# Patient Record
Sex: Female | Born: 1960 | Hispanic: Yes | Marital: Single | Smoking: Never smoker
Health system: Southern US, Community
[De-identification: ages and names within clinical notes are randomized; demographics above are authoritative.]

---

## 2018-10-07 ENCOUNTER — Ambulatory Visit: Payer: Self-pay

## 2018-10-20 ENCOUNTER — Ambulatory Visit: Payer: Self-pay

## 2018-11-27 ENCOUNTER — Other Ambulatory Visit: Payer: Self-pay

## 2018-12-01 ENCOUNTER — Other Ambulatory Visit: Payer: Self-pay

## 2018-12-02 ENCOUNTER — Ambulatory Visit
Admission: RE | Admit: 2018-12-02 | Discharge: 2018-12-02 | Disposition: A | Discharge status: Home / Self Care | Payer: Self-pay | Source: Ambulatory Visit | Attending: Oncology | Admitting: Oncology

## 2018-12-02 ENCOUNTER — Other Ambulatory Visit: Payer: Self-pay

## 2018-12-02 ENCOUNTER — Ambulatory Visit: Payer: Self-pay | Attending: Oncology

## 2018-12-02 VITALS — BP 133/61 | HR 78 | Temp 99.5°F | Ht <= 58 in | Wt 126.0 lb

## 2018-12-02 DIAGNOSIS — Z0000 Encounter for general adult medical examination without abnormal findings: Secondary | ICD-10-CM | POA: Insufficient documentation

## 2018-12-02 NOTE — Progress Notes (Signed)
  Subjective:     Patient ID: Alexandria Ayala, female   DOB: 1960-09-30, 58 y.o.   MRN: [id number]  HPI   Review of Systems     Objective:   Physical Exam Chest:     Breasts:        Right: No swelling, bleeding, inverted nipple, mass, nipple discharge, skin change or tenderness.        Left: No swelling, bleeding, inverted nipple, mass, nipple discharge, skin change or tenderness.     Comments: Right axillary axillary fatty tissue Genitourinary:    Labia:        Right: No rash, tenderness, lesion or injury.        Left: No rash, tenderness, lesion or injury.      Vagina: No signs of injury and foreign body. No vaginal discharge, erythema, tenderness, bleeding, lesions or prolapsed vaginal walls.     Cervix: No cervical motion tenderness, discharge, friability, lesion, erythema, cervical bleeding or eversion.     Uterus: Not deviated, not enlarged, not fixed, not tender and no uterine prolapse.      Adnexa:        Right: No mass, tenderness or fullness.         Left: No mass, tenderness or fullness.          Assessment:     58 year old hispanic patient presents for BCCCP clinic visit.  Jaqui Laukaitis interpreted exam.  Patient screened, and meets BCCCP eligibility.  Patient does not have insurance, Medicare or Medicaid. Instructed patient on breast self awareness using teach back method.  Clinical breast exam unremarkable.  Patient had LEEP performed at [facility] in April 2020.  CIN3 pathology result.  Recommendation for 4 month follow -up pap.  Patient requests to have pap and follow-up transferred here.     Plan:  Sent for bilateral screening mammogram.  Specimen collected for pap.

## 2018-12-03 ENCOUNTER — Other Ambulatory Visit: Payer: Self-pay

## 2018-12-03 DIAGNOSIS — R920 Mammographic microcalcification found on diagnostic imaging of breast: Secondary | ICD-10-CM

## 2018-12-09 LAB — PAP LB AND HPV HIGH-RISK: HPV, high-risk: NEGATIVE

## 2018-12-14 ENCOUNTER — Ambulatory Visit
Admission: RE | Admit: 2018-12-14 | Discharge: 2018-12-14 | Disposition: A | Discharge status: Home / Self Care | Payer: Self-pay | Source: Ambulatory Visit | Attending: Oncology | Admitting: Oncology

## 2018-12-14 DIAGNOSIS — R920 Mammographic microcalcification found on diagnostic imaging of breast: Secondary | ICD-10-CM

## 2018-12-15 ENCOUNTER — Other Ambulatory Visit: Payer: Self-pay | Admitting: *Deleted

## 2018-12-15 DIAGNOSIS — R920 Mammographic microcalcification found on diagnostic imaging of breast: Secondary | ICD-10-CM

## 2018-12-22 ENCOUNTER — Ambulatory Visit
Admission: RE | Admit: 2018-12-22 | Discharge: 2018-12-22 | Disposition: A | Discharge status: Home / Self Care | Payer: Self-pay | Source: Ambulatory Visit | Attending: Oncology | Admitting: Oncology

## 2018-12-22 DIAGNOSIS — R920 Mammographic microcalcification found on diagnostic imaging of breast: Secondary | ICD-10-CM

## 2018-12-22 HISTORY — PX: BREAST BIOPSY: SHX20

## 2018-12-23 LAB — SURGICAL PATHOLOGY

## 2019-01-04 NOTE — Progress Notes (Signed)
Phoned patient with Erich Montane interpreter to discuss BCCCP reslts.  Pap result ASCUS/HPV Negative.  Will repeat pap in 3 Years.  Patient will be scheduled for 6 month follow-up mammogram for calcifications in lower left breast.

## 2019-02-11 ENCOUNTER — Other Ambulatory Visit: Payer: Self-pay

## 2019-02-11 DIAGNOSIS — R920 Mammographic microcalcification found on diagnostic imaging of breast: Secondary | ICD-10-CM

## 2019-02-11 NOTE — Progress Notes (Signed)
Patient scheduled to return 06/21/2019 for 6 month follow-up left breast mammogram/calcifications.  Mailed appointment information.  Copy to [facility].

## 2019-06-21 ENCOUNTER — Ambulatory Visit
Admission: RE | Admit: 2019-06-21 | Discharge: 2019-06-21 | Disposition: A | Discharge status: Home / Self Care | Payer: Self-pay | Source: Ambulatory Visit | Attending: Oncology | Admitting: Oncology

## 2019-06-21 DIAGNOSIS — R920 Mammographic microcalcification found on diagnostic imaging of breast: Secondary | ICD-10-CM | POA: Insufficient documentation

## 2019-06-22 NOTE — Progress Notes (Unsigned)
6 month follow up mammogram with Birads 3 results.  Will schedule annual and 6 month follow-up BCCCP visit and mail appointment reminder to patient.

## 2019-07-09 ENCOUNTER — Ambulatory Visit: Payer: Self-pay | Attending: Internal Medicine

## 2019-07-09 DIAGNOSIS — Z23 Encounter for immunization: Secondary | ICD-10-CM

## 2019-07-09 NOTE — Progress Notes (Signed)
   Covid-19 Vaccination Clinic  Name:  Takasha Vetere    MRN: [id number] DOB: 18-Jun-1960  07/09/2019  [name] Edward Jolly was observed post Covid-19 immunization for 15 minutes without incident. She was provided with Vaccine Information Sheet and instruction to access the V-Safe system.   Ms. Aislyn Hayse was instructed to call 911 with any severe reactions post vaccine: Marland Kitchen Difficulty breathing  . Swelling of face and throat  . A fast heartbeat  . A bad rash all over body  . Dizziness and weakness   Immunizations Administered    Name Date Dose VIS Date Route   Pfizer COVID-19 Vaccine 07/09/2019  4:27 PM 0.3 mL 03/26/2019 Intramuscular   Manufacturer: [employer], [employer]   Lot: [id number]   NDC: [address]-4

## 2019-07-30 ENCOUNTER — Ambulatory Visit: Payer: Self-pay | Attending: Internal Medicine

## 2019-07-30 DIAGNOSIS — Z23 Encounter for immunization: Secondary | ICD-10-CM

## 2019-07-30 NOTE — Progress Notes (Signed)
   Covid-19 Vaccination Clinic  Name:  Alexandria Ayala    MRN: [id number] DOB: Feb 06, 1961  07/30/2019  [name] Edward Jolly was observed post Covid-19 immunization for 15 minutes without incident. She was provided with Vaccine Information Sheet and instruction to access the V-Safe system.   [name] was instructed to call 911 with any severe reactions post vaccine: Marland Kitchen Difficulty breathing  . Swelling of face and throat  . A fast heartbeat  . A bad rash all over body  . Dizziness and weakness   Immunizations Administered    Name Date Dose VIS Date Route   Pfizer COVID-19 Vaccine 07/30/2019  3:48 PM 0.3 mL 03/26/2019 Intramuscular   Manufacturer: [employer], [employer]   Lot: [id number]   NDC: [address]-6

## 2019-12-22 ENCOUNTER — Ambulatory Visit: Payer: Self-pay | Attending: Oncology | Admitting: *Deleted

## 2019-12-22 ENCOUNTER — Ambulatory Visit
Admission: RE | Admit: 2019-12-22 | Discharge: 2019-12-22 | Disposition: A | Discharge status: Home / Self Care | Payer: Self-pay | Source: Ambulatory Visit | Attending: Oncology | Admitting: Oncology

## 2019-12-22 ENCOUNTER — Encounter: Payer: Self-pay | Admitting: *Deleted

## 2019-12-22 ENCOUNTER — Other Ambulatory Visit: Payer: Self-pay

## 2019-12-22 ENCOUNTER — Encounter (INDEPENDENT_AMBULATORY_CARE_PROVIDER_SITE_OTHER): Payer: Self-pay

## 2019-12-22 VITALS — BP 116/71 | HR 68 | Temp 98.2°F | Ht <= 58 in | Wt 125.0 lb

## 2019-12-22 DIAGNOSIS — R920 Mammographic microcalcification found on diagnostic imaging of breast: Secondary | ICD-10-CM

## 2019-12-22 NOTE — Progress Notes (Signed)
°  Subjective:     Patient ID: Alexandria Ayala, female   DOB: December 28, 1960, 59 y.o.   MRN: [id number]    Review of Systems     Objective:   Physical Exam Chest:     Breasts:        Right: No swelling, bleeding, inverted nipple, mass, nipple discharge, skin change or tenderness.        Left: No swelling, bleeding, inverted nipple, mass, nipple discharge, skin change or tenderness.  Lymphadenopathy:     Upper Body:     Right upper body: No supraclavicular or axillary adenopathy.     Left upper body: No supraclavicular or axillary adenopathy.        Assessment:     59 year old Hispanic female returns to [facility] for 6 month follow up and annual mammogram.  Last mammogram on 06/21/19 was a birads 3 with calcifications.  Last pap on 12/02/18 was ASCUS/HPV negative.  Next pap due in 2023.  Clinical breast exam without dominant mass, skin changes, nipple discharge, or lymphadenopathy.  Taught self breast awareness.  Patient has been screened for eligibility.  She does not have any insurance, Medicare or Medicaid.  She also meets financial eligibility.   Risk Assessment   No risk assessment data for the current encounter  Risk Scores      12/02/2018   Last edited by: Jim Like, RN   5-year risk: 1 %   Lifetime risk: 5.9 %            Risk Assessment    Risk Scores      12/22/2019 12/02/2018   Last edited by: Alta Corning, CMA Jim Like, RN   5-year risk: 1 % 1 %   Lifetime risk: 5.8 % 5.9 %         Plan:     Bilateral diagnositic mammogram and ultrasound ordered.  Will follow up per BCCCP protocol.

## 2019-12-22 NOTE — Patient Instructions (Signed)
Gave patient hand-out, Women Staying Healthy, Active and Well from BCCCP, with education on breast health, pap smears, heart and colon health. 

## 2020-01-17 ENCOUNTER — Encounter: Payer: Self-pay | Admitting: *Deleted

## 2020-01-17 NOTE — Progress Notes (Signed)
Letter mailed to inform patient of her next appointment on 12/26/20 @ 8:00.

## 2020-02-23 IMAGING — MG MM DIGITAL DIAGNOSTIC UNILAT*L*
4 series · 4 of 12 positions shown · non-contrast
Comparison: Previous exam(s).

CLINICAL DATA: Post stereotactic guided biopsy of the more anterior
group of calcifications in the lower inner posterior left breast.

EXAM:
DIAGNOSTIC LEFT MAMMOGRAM POST STEREOTACTIC BIOPSY

[L CC synth-2D]
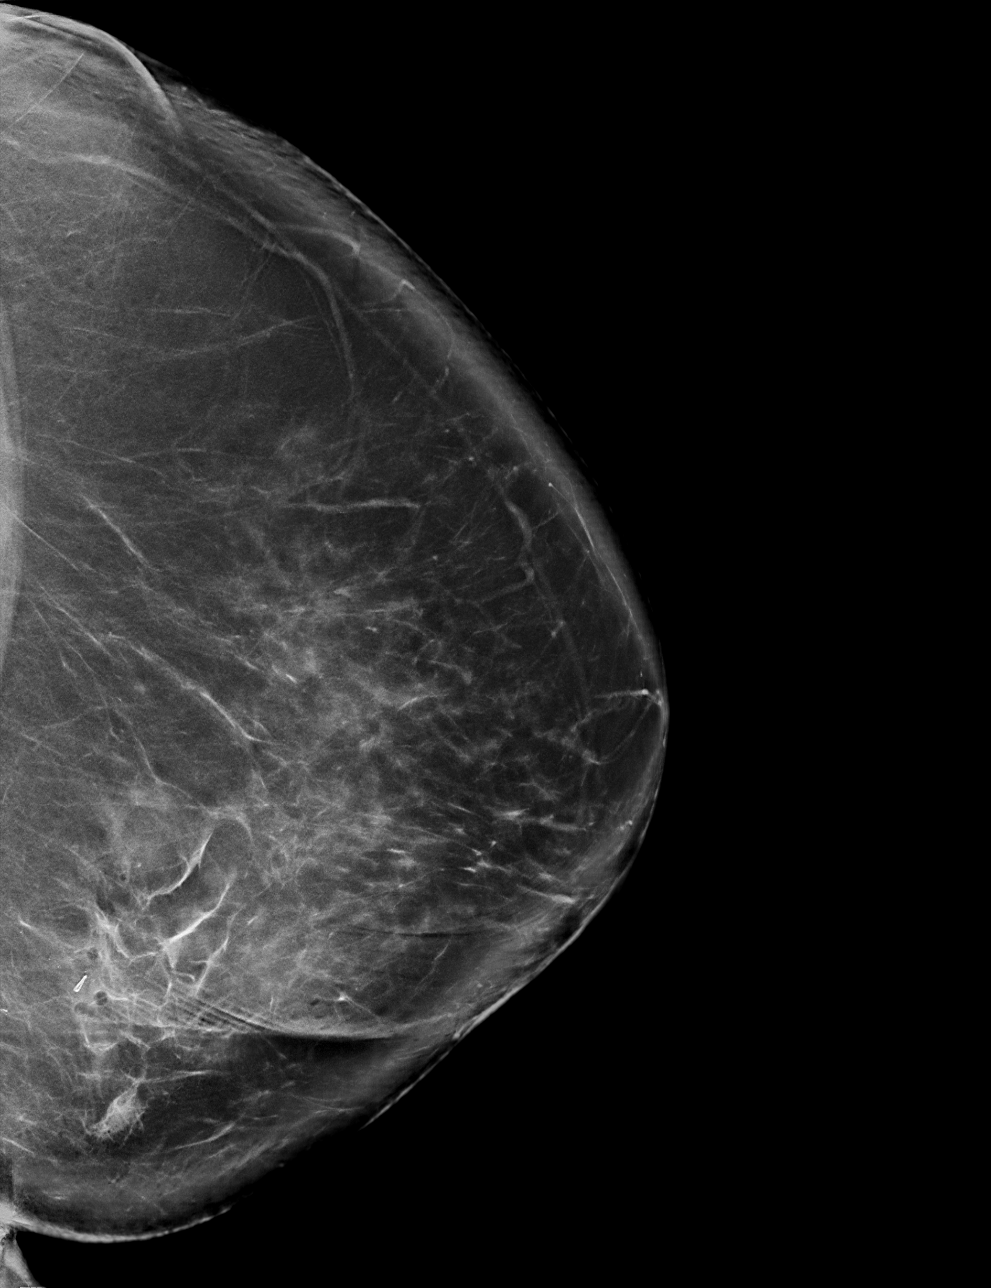

[L ML synth-2D]
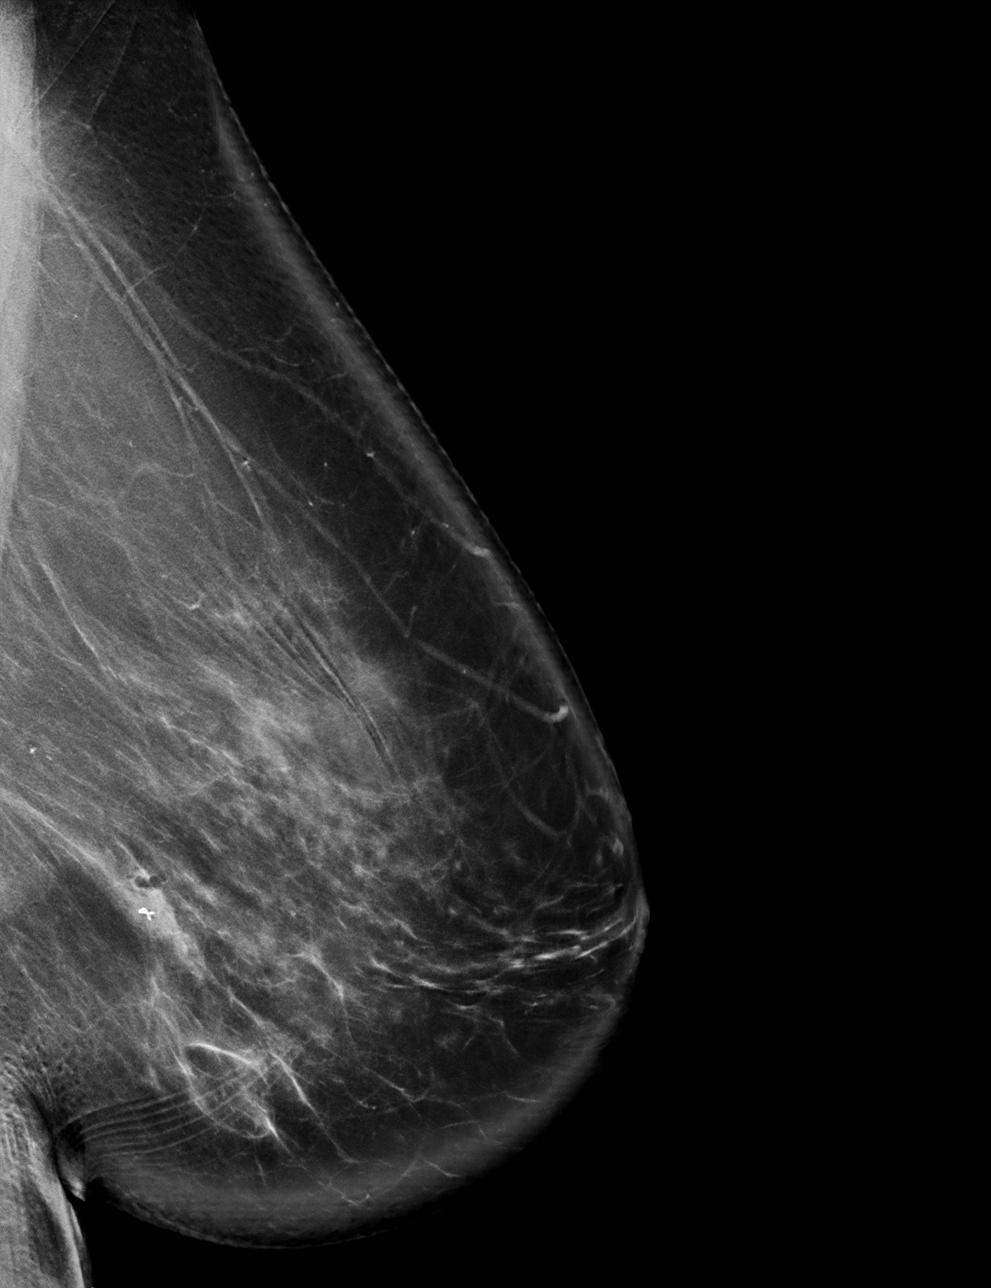

[L ML tomo · tomo slice 53/106.0]
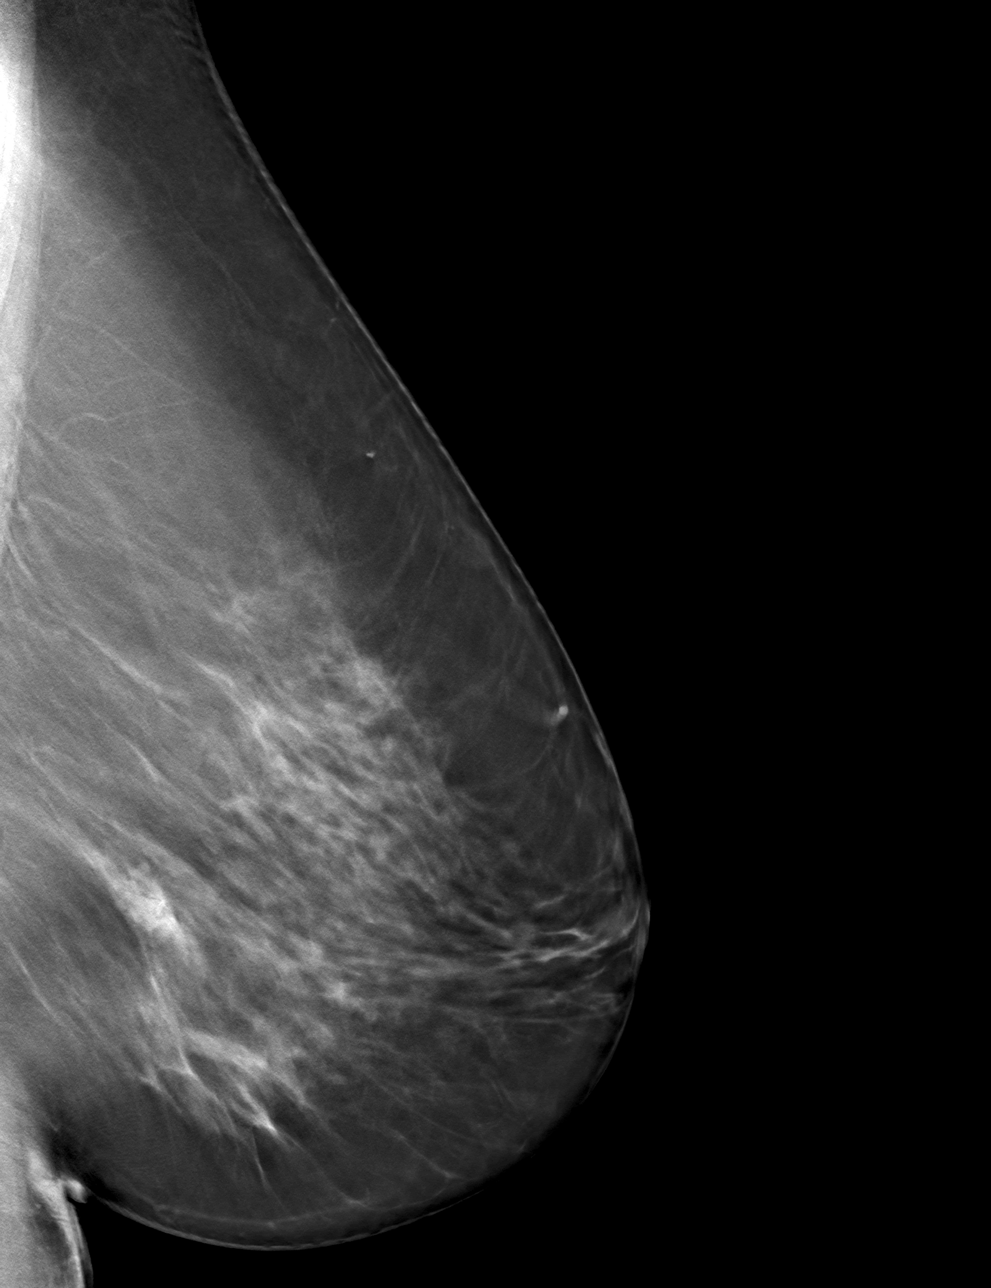

[L CC tomo · tomo slice 49/97.0]
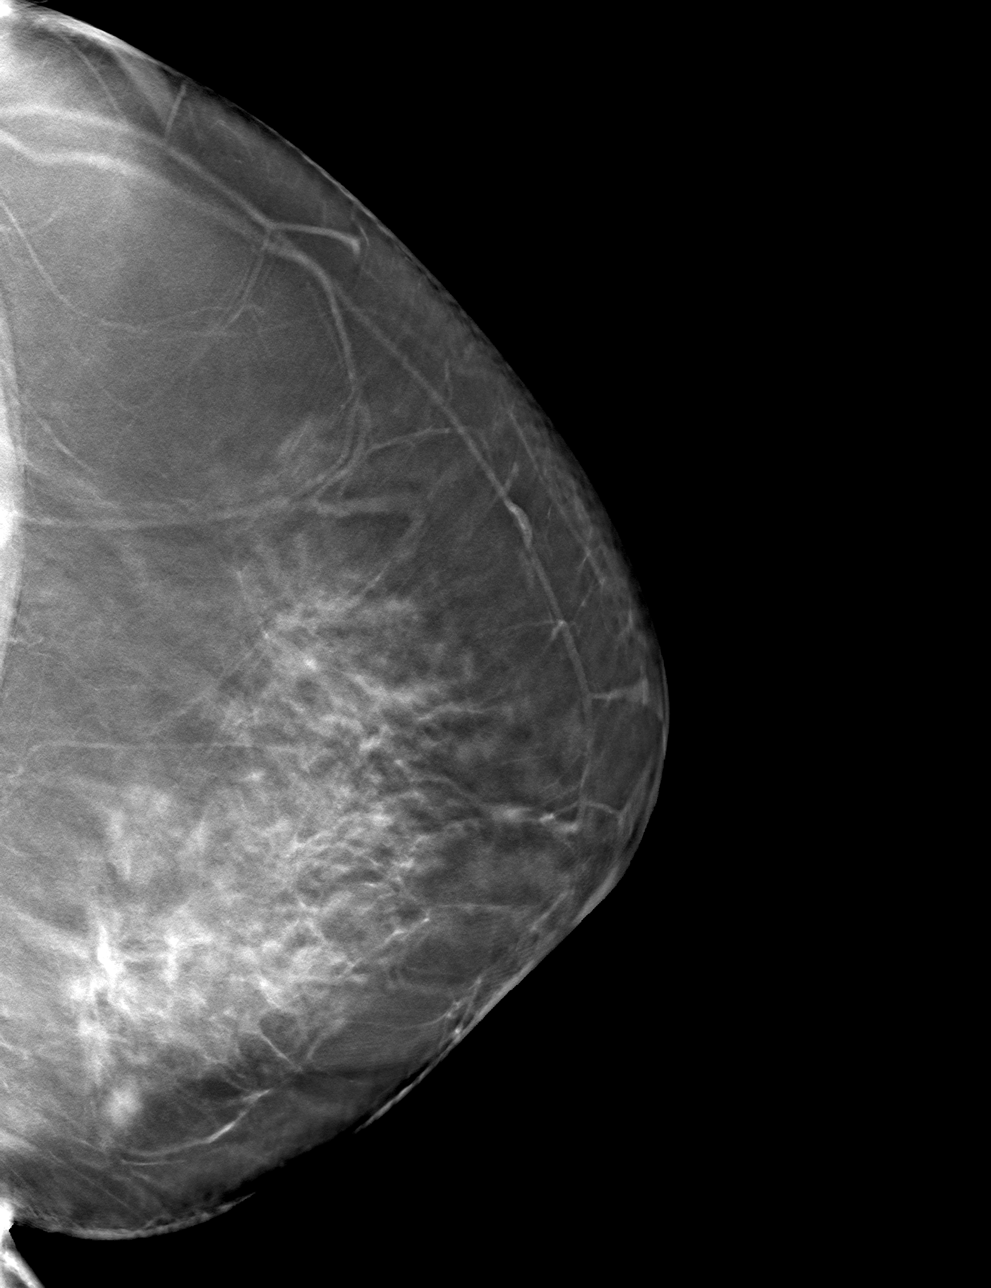

[4 of 12 positions shown; findings below may reference images not displayed]

FINDINGS: Mammographic images were obtained following stereotactic guided
biopsy of the more anterior group of calcifications in the lower
inner posterior left breast. A ribbon shaped biopsy marking clip is
present at the site of the biopsied calcifications in the lower
inner posterior left breast.
IMPRESSION: Ribbon shaped biopsy marking clip at site of biopsied calcifications
in the lower inner posterior left breast.

Final Assessment: Post Procedure Mammograms for Marker Placement

## 2020-12-26 ENCOUNTER — Other Ambulatory Visit: Payer: Self-pay

## 2020-12-26 ENCOUNTER — Ambulatory Visit
Admission: RE | Admit: 2020-12-26 | Discharge: 2020-12-26 | Disposition: A | Discharge status: Home / Self Care | Payer: Self-pay | Source: Ambulatory Visit | Attending: Oncology | Admitting: Oncology

## 2020-12-26 ENCOUNTER — Encounter: Payer: Self-pay | Admitting: *Deleted

## 2020-12-26 ENCOUNTER — Ambulatory Visit: Payer: Self-pay | Attending: Oncology | Admitting: *Deleted

## 2020-12-26 ENCOUNTER — Encounter (INDEPENDENT_AMBULATORY_CARE_PROVIDER_SITE_OTHER): Payer: Self-pay

## 2020-12-26 VITALS — BP 124/59 | HR 64 | Temp 98.1°F | Ht <= 58 in | Wt 113.6 lb

## 2020-12-26 DIAGNOSIS — R920 Mammographic microcalcification found on diagnostic imaging of breast: Secondary | ICD-10-CM

## 2020-12-26 NOTE — Progress Notes (Signed)
  Subjective:     Patient ID: Alexandria Ayala, female   DOB: 1961/03/28, 60 y.o.   MRN: [id number]  HPI  BCCCP Medical History Record - 12/26/20 0831       Breast History   Screening cycle New    Provider (CBE) [facility]    Initial Mammogram 12/26/20    Last Mammogram Annual    Last Mammogram Date 12/22/19    Provider (Mammogram)  Delford Field    Recent Breast Symptoms None      Breast Cancer History   Breast Cancer History No personal or family history      Previous History of Breast Problems   Breast Surgery or Biopsy --   12/22/19 left benign calcifications   Breast Implants N/A    BSE Done Monthly      Gynecological/Obstetrical History   LMP --   60 yo   Is there any chance that the client could be pregnant?  No    Age at menarche 43    Age at menopause 55    PAP smear history Annually    Date of last PAP  12/02/18    Provider (PAP) [facility] ASCUS/HPV negative    Age at first live birth 19    Breast fed children Yes (type length in comments)   7 months   DES Exposure No    Cervical, Uterine or Ovarian cancer No    Family history of Cervial, Uterine or Ovarian cancer No    Hysterectomy No    Cervix removed No    Ovaries removed No    Laser/Cryosurgery No    Current method of birth control None    Current method of Estrogen/Hormone replacement None    Smoking history None               Review of Systems     Objective:   Physical Exam Chest:  Breasts:    Right: No swelling, bleeding, inverted nipple, mass, nipple discharge, skin change or tenderness.     Left: No swelling, bleeding, inverted nipple, mass, nipple discharge, skin change or tenderness.    Lymphadenopathy:     Upper Body:     Right upper body: No supraclavicular or axillary adenopathy.     Left upper body: No supraclavicular or axillary adenopathy.      Assessment:     60 year old female returns to [facility] for annual clinical breast exam and mammogram.  Last  mammogram on 12/22/19 was a birads 3 recommending 1 year follow up for benign left breast calcs. [name] # [id number], the [employer] interpreter is present during the interview and exam.  Clinical breast exam is unremarkable.  Taught self breast awareness.  Last pap on 12/02/18 was HPV negative ASCUS.  Next pap due in 2023.  Patient has been screened for eligibility.  She does not have any insurance, Medicare or Medicaid.  She also meets financial eligibility.   Risk Assessment     Risk Scores       12/26/2020 12/22/2019   Last edited by: Scarlett Presto, RN [place], Freada Bergeron, CMA   5-year risk: 1.2 % 1 %   Lifetime risk: 6.6 % 5.8 %               Plan:     Bilateral diagnostic mammogram ordered for follow up of left breast calcs.  Will follow up per BCCCP protocol.

## 2020-12-28 ENCOUNTER — Encounter: Payer: Self-pay | Admitting: *Deleted

## 2020-12-28 NOTE — Progress Notes (Signed)
Letter mailed from the [facility] to inform patient of her normal mammogram results.  Patient is to follow-up with annual screening in one year. 

## 2022-03-18 ENCOUNTER — Ambulatory Visit (LOCAL_COMMUNITY_HEALTH_CENTER): Payer: Self-pay

## 2022-03-18 DIAGNOSIS — Z719 Counseling, unspecified: Secondary | ICD-10-CM

## 2022-03-18 DIAGNOSIS — Z23 Encounter for immunization: Secondary | ICD-10-CM

## 2022-03-18 NOTE — Progress Notes (Signed)
  Are you feeling sick today? No   Have you ever received a dose of COVID-19 Vaccine? [employer], [place], [place], [place], Other) Yes  If yes, which vaccine and how many doses?   5 doses Pfizer   Did you bring the vaccination record card or other documentation?  Yes   Do you have a health condition or are undergoing treatment that makes you moderately or severely immunocompromised? This would include, but not be limited to: cancer, HIV, organ transplant, immunosuppressive therapy/high-dose corticosteroids, or moderate/severe primary immunodeficiency.  No  Have you received COVID-19 vaccine before or during hematopoietic cell transplant (HCT) or CAR-T-cell therapies? No  Have you ever had an allergic reaction to: (This would include a severe allergic reaction or a reaction that caused hives, swelling, or respiratory distress, including wheezing.) A component of a COVID-19 vaccine or a previous dose of COVID-19 vaccine? No   Have you ever had an allergic reaction to another vaccine (other thanCOVID-19 vaccine) or an injectable medication? (This would include a severe allergic reaction or a reaction that caused hives, swelling, or respiratory distress, including wheezing.)   No    Do you have a history of any of the following:  Myocarditis or Pericarditis No  Dermal fillers:  No  Multisystem Inflammatory Syndrome (MIS-C or MIS-A)? No  COVID-19 disease within the past 3 months? No  Vaccinated with monkeypox vaccine in the last 4 weeks? No  Flu IM in left deltoid.  Comirnaty +12Y IM in rt deltoid.  Tolerated well  VIS provided. [facility] updated and copy provided. COVID card updated.  Did not want to wait due to late for another appointment. Patient reported no problems with other COVID vaccines. Alexandria Ayala- daughter provided interpretation.

## 2022-03-26 ENCOUNTER — Other Ambulatory Visit: Payer: Self-pay

## 2022-03-26 DIAGNOSIS — Z1231 Encounter for screening mammogram for malignant neoplasm of breast: Secondary | ICD-10-CM

## 2022-04-01 ENCOUNTER — Ambulatory Visit: Payer: Self-pay | Attending: Hematology and Oncology | Admitting: Hematology and Oncology

## 2022-04-01 ENCOUNTER — Other Ambulatory Visit: Payer: Self-pay

## 2022-04-01 ENCOUNTER — Ambulatory Visit
Admission: RE | Admit: 2022-04-01 | Discharge: 2022-04-01 | Disposition: A | Discharge status: Home / Self Care | Payer: Self-pay | Source: Ambulatory Visit | Attending: Obstetrics and Gynecology | Admitting: Obstetrics and Gynecology

## 2022-04-01 VITALS — BP 150/70 | Wt 120.9 lb

## 2022-04-01 DIAGNOSIS — Z124 Encounter for screening for malignant neoplasm of cervix: Secondary | ICD-10-CM

## 2022-04-01 DIAGNOSIS — Z1231 Encounter for screening mammogram for malignant neoplasm of breast: Secondary | ICD-10-CM | POA: Insufficient documentation

## 2022-04-01 DIAGNOSIS — Z1211 Encounter for screening for malignant neoplasm of colon: Secondary | ICD-10-CM

## 2022-04-01 DIAGNOSIS — Z01419 Encounter for gynecological examination (general) (routine) without abnormal findings: Secondary | ICD-10-CM

## 2022-04-01 NOTE — Progress Notes (Signed)
Alexandria Ayala is Ayala 61 y.o. No obstetric history on file. female who presents to [facility] clinic today with no complaints.    Pap Smear: Pap smear completed today. Last Pap smear was 2020 at [facility] and was abnormal - ASCUS/ HPV- . Per patient has history of an abnormal Pap smear. Last Pap smear result is not available in Epic. 12/02/2018 ASCUS/ HPV-; 07/16/2018 [facility] with LEEP.   Physical exam: Breasts Breasts symmetrical. No skin abnormalities bilateral breasts. No nipple retraction bilateral breasts. No nipple discharge bilateral breasts. No lymphadenopathy. No lumps palpated bilateral breasts.       Pelvic/Bimanual Ext Genitalia No lesions, no swelling and no discharge observed on external genitalia.        Vagina Vagina pink and normal texture. No lesions or discharge observed in vagina.        Cervix Cervix is present. Cervix pink and of normal texture. No discharge observed.    Uterus Uterus is present and palpable. Uterus in normal position and normal size.        Adnexae Bilateral ovaries present and palpable. No tenderness on palpation.         Rectovaginal No rectal exam completed today since patient had no rectal complaints. No skin abnormalities observed on exam.     Smoking History: Patient has never smoked and was not referred to quit line.    Patient Navigation: Patient education provided. Access to services provided for patient through BCCCP program. Alexandria Ayala interpreter provided. No transportation provided   Colorectal Cancer Screening: Per patient has never had colonoscopy completed No complaints today. FIT test given.   Breast and Cervical Cancer Risk Assessment: Patient does not have family history of breast cancer, known genetic mutations, or radiation treatment to the chest before age 89. Patient has history of cervical dysplasia, immunocompromised, or DES exposure in-utero.  Risk Assessment   No risk assessment data for the  current encounter  Risk Scores       12/26/2020   Last edited by: Alexandria Presto, RN   5-year risk: 1.2 %   Lifetime risk: 6.6 %            Ayala: BCCCP exam with pap smear No complaints with benign exams.   P: [facility] for [facility] screening mammogram. Appointment scheduled 04/01/22.  Alexandria Basset A, NP 04/01/2022 10:05 AM

## 2022-04-01 NOTE — Patient Instructions (Signed)
Taught Alexandria Ayala about self breast awareness and gave educational materials to take home. Patient did need a Pap smear today due to last Pap smear was in 2020 per patient. Let her know [facility] will cover Pap smears every 5 years unless has a history of abnormal Pap smears. [facility] to the [facility] for diagnostic mammogram. Appointment scheduled for 04/01/22. Patient aware of appointment and will be there. Let patient know will follow up with her within the next couple weeks with results. Alexandria Ayala verbalized understanding.  Pascal Lux, NP 10:24 AM

## 2022-04-04 LAB — CYTOLOGY - PAP
Comment: NEGATIVE
Comment: NEGATIVE
Comment: NEGATIVE
Diagnosis: NEGATIVE
Diagnosis: REACTIVE
HPV 16: POSITIVE — AB
HPV 18 / 45: NEGATIVE
High risk HPV: POSITIVE — AB

## 2022-04-05 LAB — FECAL OCCULT BLOOD, IMMUNOCHEMICAL: Fecal Occult Bld: NEGATIVE

## 2022-04-10 ENCOUNTER — Telehealth: Payer: Self-pay

## 2022-04-10 NOTE — Telephone Encounter (Addendum)
Via, Alexandria Ayala, [facility] Spanish Interpreter, Patient informed pap results-negative with positive HPV 16, per doctor's recommendations, needs to do colposcopy. Discussed with patient. Patient to be scheduled for colonoscopy with [facility] per [facility]). Patient also informed negative FIT test results. Patient verbalized understanding.

## 2022-04-10 NOTE — Telephone Encounter (Signed)
Via Karl Luke, Spanish Interpeter  @ [employer] attempted to contact patient regarding Lab (pap/FIT test) results, not able to leave message on voicemail.

## 2022-05-02 ENCOUNTER — Encounter: Payer: Self-pay | Admitting: Obstetrics and Gynecology

## 2022-05-02 ENCOUNTER — Ambulatory Visit (INDEPENDENT_AMBULATORY_CARE_PROVIDER_SITE_OTHER): Payer: Self-pay | Admitting: Obstetrics and Gynecology

## 2022-05-02 ENCOUNTER — Other Ambulatory Visit (HOSPITAL_COMMUNITY)
Admission: RE | Admit: 2022-05-02 | Discharge: 2022-05-02 | Disposition: A | Discharge status: Home / Self Care | Payer: Self-pay | Source: Ambulatory Visit | Attending: Obstetrics and Gynecology | Admitting: Obstetrics and Gynecology

## 2022-05-02 VITALS — BP 127/80 | HR 75 | Ht <= 58 in | Wt 122.6 lb

## 2022-05-02 DIAGNOSIS — N870 Mild cervical dysplasia: Secondary | ICD-10-CM

## 2022-05-02 DIAGNOSIS — R87810 Cervical high risk human papillomavirus (HPV) DNA test positive: Secondary | ICD-10-CM | POA: Insufficient documentation

## 2022-05-02 DIAGNOSIS — Z7689 Persons encountering health services in other specified circumstances: Secondary | ICD-10-CM

## 2022-05-02 NOTE — Progress Notes (Signed)
  HPI:  Alexandria Ayala is a 62 y.o.  No obstetric history on file.  who presents today for evaluation and management of abnormal cervical cytology.    Dysplasia History: History of LEEP approximately 4 years ago     HPV: Type 16    Current HPV positive type 16-current normal cellularity  ROS:  Pertinent items noted in HPI and remainder of comprehensive ROS otherwise negative.  OB History  No obstetric history on file.    History reviewed. No pertinent past medical history.  Past Surgical History:  Procedure Laterality Date   BREAST BIOPSY Left 12/22/2018   affirm bx LT, ribbon marker, BENIGN MAMMARY PARENCHYMA WITH FIBROCYSTIC AND FIBROADENOMATOID CHANGES, WITH ASSOCIATED CALCIFICATIONS    SOCIAL HISTORY:  Social History   Substance and Sexual Activity  Alcohol Use Not Currently    Social History   Substance and Sexual Activity  Drug Use Never     Family History  Problem Relation Age of Onset   Breast cancer Neg Hx     ALLERGIES:  Patient has no known allergies.  She currently has no medications in their medication list.  Physical Exam: -Vitals:  BP 127/80   Pulse 75   Ht 4\' 9"  (1.448 m)   Wt 122 lb 9.6 oz (55.6 kg)   BMI 26.53 kg/m   PROCEDURE: Colposcopy performed with 4% acetic acid after verbal consent obtained                           -Aceto-white Lesions Location(s): See above (simply appears as a generally atrophic cervix -similar to the way cryo's used to look              -Biopsy performed at 7 o'clock               -ECC indicated and performed: No.     -Biopsy sites made hemostatic with pressure and Monsel's solution   -Satisfactory colposcopy: Yes.      -Evidence of Invasive cervical CA :  NO  ASSESSMENT:  Alexandria Ayala is a 62 y.o. No obstetric history on file. here for  1. 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 Establishing care with new doctor, encounter for   .  PLAN: 1.  I  discussed the grading system of pap smears and HPV high risk viral types.  We will discuss management after colpo results return.  No orders of the defined types were placed in this encounter.          F/U  Return in about 2 weeks (around 05/16/2022) for Colpo f/u.  Jeannie Fend ,MD 05/02/2022,8:49 AM

## 2022-05-02 NOTE — Progress Notes (Signed)
Patient presents for a colposcopy today. She recently had an abnormal pap mere resulting in HPV+ type 16. No additional questions.

## 2022-05-06 LAB — SURGICAL PATHOLOGY

## 2022-05-09 ENCOUNTER — Encounter: Payer: Self-pay | Admitting: Obstetrics and Gynecology

## 2022-05-21 ENCOUNTER — Telehealth: Payer: Self-pay | Admitting: Obstetrics and Gynecology

## 2022-05-21 NOTE — Telephone Encounter (Signed)
Reached out to pt via interpreter to reschedule 05/22/2022 appt with Dr. Amalia Hailey.  Left message via interpreter for pt to call back to reschedule.  Looking at Tuesday, Feb. 13 at 8:45 to reschedule.

## 2022-05-22 ENCOUNTER — Ambulatory Visit: Payer: Self-pay | Admitting: Obstetrics and Gynecology

## 2022-05-22 DIAGNOSIS — N870 Mild cervical dysplasia: Secondary | ICD-10-CM

## 2022-05-28 ENCOUNTER — Encounter: Payer: Self-pay | Admitting: Obstetrics and Gynecology

## 2022-05-28 ENCOUNTER — Ambulatory Visit (INDEPENDENT_AMBULATORY_CARE_PROVIDER_SITE_OTHER): Payer: Self-pay | Admitting: Obstetrics and Gynecology

## 2022-05-28 VITALS — BP 134/79 | HR 68 | Wt 124.5 lb

## 2022-05-28 DIAGNOSIS — B977 Papillomavirus as the cause of diseases classified elsewhere: Secondary | ICD-10-CM

## 2022-05-28 DIAGNOSIS — N870 Mild cervical dysplasia: Secondary | ICD-10-CM

## 2022-05-28 NOTE — Progress Notes (Signed)
HPI:      [name] is a 62 y.o. No obstetric history on file. who LMP was No LMP recorded. Patient is postmenopausal.  Subjective:   She presents today for follow-up of her colposcopy.  She had an abnormal Pap with type 16 virus.  She underwent colposcopy.  Minimal changes were noted to be exocervix at colposcopy.    Hx: The following portions of the patient's history were reviewed and updated as appropriate:             She  has no past medical history on file. She does not have a problem list on file. She  has a past surgical history that includes Breast biopsy (Left, 12/22/2018). Her family history is not on file. She  reports that she has never smoked. She has never used smokeless tobacco. She reports that she does not currently use alcohol. She reports that she does not use drugs. She currently has no medications in their medication list. She has No Known Allergies.       Review of Systems:  Review of Systems  Constitutional: Denied constitutional symptoms, night sweats, recent illness, fatigue, fever, insomnia and weight loss.  Eyes: Denied eye symptoms, eye pain, photophobia, vision change and visual disturbance.  Ears/Nose/Throat/Neck: Denied ear, nose, throat or neck symptoms, hearing loss, nasal discharge, sinus congestion and sore throat.  Cardiovascular: Denied cardiovascular symptoms, arrhythmia, chest pain/pressure, edema, exercise intolerance, orthopnea and palpitations.  Respiratory: Denied pulmonary symptoms, asthma, pleuritic pain, productive sputum, cough, dyspnea and wheezing.  Gastrointestinal: Denied, gastro-esophageal reflux, melena, nausea and vomiting.  Genitourinary: Denied genitourinary symptoms including symptomatic vaginal discharge, pelvic relaxation issues, and urinary complaints.  Musculoskeletal: Denied musculoskeletal symptoms, stiffness, swelling, muscle weakness and myalgia.  Dermatologic: Denied dermatology symptoms, rash and scar.   Neurologic: Denied neurology symptoms, dizziness, headache, neck pain and syncope.  Psychiatric: Denied psychiatric symptoms, anxiety and depression.  Endocrine: Denied endocrine symptoms including hot flashes and night sweats.   Meds:   No current outpatient medications on file prior to visit.   No current facility-administered medications on file prior to visit.      Objective:     Vitals:   05/28/22 0853  BP: 134/79  Pulse: 68   Filed Weights   05/28/22 0853  Weight: 124 lb 8 oz (56.5 kg)                        Assessment:    No obstetric history on file. There are no problems to display for this patient.    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 Dysplasia of cervix, low grade (CIN 1)     CIN-1 by colposcopically directed biopsies.   Plan:            1.  We have discussed CIN-1 in detail.  Based on ASCCP guidelines I have recommended a follow-up Pap smear in 1 year.  Future colposcopy or expectant management based on that Pap.  I have discussed this with the patient.  All questions have been answered. Orders No orders of the defined types were placed in this encounter.   No orders of the defined types were placed in this encounter.     F/U  Return in about 1 year (around 05/29/2023). I spent 22 minutes involved in the care of this patient preparing to see the patient by obtaining and reviewing her medical history (including labs, imaging tests and prior procedures), documenting clinical information in the  electronic health record (EHR), counseling and coordinating care plans, writing and sending prescriptions, ordering tests or procedures and in direct communicating with the patient and medical staff discussing pertinent items from her history and physical exam.  Finis Bud, M.D. 05/28/2022 9:27 AM

## 2023-04-11 ENCOUNTER — Other Ambulatory Visit: Payer: Self-pay

## 2023-04-11 DIAGNOSIS — Z1231 Encounter for screening mammogram for malignant neoplasm of breast: Secondary | ICD-10-CM

## 2023-04-21 ENCOUNTER — Ambulatory Visit: Payer: Self-pay | Attending: Hematology and Oncology | Admitting: *Deleted

## 2023-04-21 ENCOUNTER — Ambulatory Visit
Admission: RE | Admit: 2023-04-21 | Discharge: 2023-04-21 | Disposition: A | Discharge status: Home / Self Care | Payer: Self-pay | Source: Ambulatory Visit | Attending: Obstetrics and Gynecology | Admitting: Obstetrics and Gynecology

## 2023-04-21 VITALS — BP 118/68 | Wt 118.0 lb

## 2023-04-21 DIAGNOSIS — Z1231 Encounter for screening mammogram for malignant neoplasm of breast: Secondary | ICD-10-CM | POA: Insufficient documentation

## 2023-04-21 DIAGNOSIS — Z1239 Encounter for other screening for malignant neoplasm of breast: Secondary | ICD-10-CM

## 2023-04-21 NOTE — Patient Instructions (Signed)
 Explained breast self awareness with Alexandria Ayala. Let patient know that due to her history that a Pap smear is due after 05/03/2023. Let her know that she either needs to follow up with Dr. Janit or can come back to [facility] for her follow up Pap smear. Referred patient to the [facility] for a screening mammogram. Appointment scheduled Monday, April 21, 2023 at 1100. Patient aware of appointment and will be there. Let patient know Raymondo will follow up with her within the next couple weeks with results of her mammogram by letter or phone. [name] verbalized understanding.  Adajah Cocking, Wanda Ship, RN 9:37 AM

## 2023-04-21 NOTE — Progress Notes (Addendum)
 [name] is a 63 y.o. female who presents to [facility] clinic today with no complaints.    Pap Smear: Pap smear not completed today. 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 Per patient has history of three other abnormal Pap smears 12/02/2018 ASCUS with negative HPV, 07/16/2018 at [facility] that was HGSIL, and 06/11/2018 at [facility] that was HSIL positive HPV. Patient had a LEEP completed 07/29/2018 for follow up. Last Pap smear result is available in Epic.   Physical exam: Breasts Breasts symmetrical. No skin abnormalities bilateral breasts. No nipple retraction bilateral breasts. No nipple discharge bilateral breasts. No lymphadenopathy. No lumps palpated bilateral breasts. No complaints of pain or tenderness on exam.      MS DIGITAL SCREENING TOMO BILATERAL Result Date: 04/02/2022 CLINICAL DATA:  Screening. EXAM: DIGITAL SCREENING BILATERAL MAMMOGRAM WITH TOMOSYNTHESIS AND CAD TECHNIQUE: Bilateral screening digital craniocaudal and mediolateral oblique mammograms were obtained. Bilateral screening digital breast tomosynthesis was performed. The images were evaluated with computer-aided detection. COMPARISON:  Previous exam(s). ACR Breast Density Category b: There are scattered areas of fibroglandular density. FINDINGS: There are no findings suspicious for malignancy. IMPRESSION: No mammographic evidence of malignancy. A result letter of this screening mammogram will be mailed directly to the patient. RECOMMENDATION: Screening mammogram in one year. (Code:SM-B-01Y) BI-RADS CATEGORY  1: Negative. Electronically Signed   By: Rosaline Collet M.D.   On: 04/02/2022 16:08   MS DIGITAL DIAG TOMO BILAT Result Date: 12/26/2020 CLINICAL DATA:  BI-RADS 3 follow-up of LEFT breast calcifications, initiated September 2020 after benign biopsy of a similar-appearing  group EXAM: DIGITAL DIAGNOSTIC BILATERAL MAMMOGRAM WITH TOMOSYNTHESIS AND CAD TECHNIQUE: Bilateral digital diagnostic mammography and breast tomosynthesis was performed. The images were evaluated with computer-aided detection. COMPARISON:  Previous exam(s). ACR Breast Density Category b: There are scattered areas of fibroglandular density. FINDINGS: Spot magnification views of the LEFT lower inner breast demonstrate significant change in loosely grouped coarse heterogeneous calcifications at posterior depth. Biopsy clip is noted anterior to these calcifications. No new suspicious findings in the LEFT breast. No suspicious mass, distortion, or microcalcifications are identified to suggest presence of malignancy. IMPRESSION: 1. Calcifications in the LEFT lower inner breast have been mammographically stable for greater than 2 years, consistent with a benign etiology. 2. No mammographic evidence of malignancy bilaterally. RECOMMENDATION: Screening mammogram in one year.(Code:SM-B-01Y) I have discussed the findings and recommendations with the patient with the assistance of a Spanish interpreter. If applicable, a reminder letter will be sent to the patient regarding the next appointment. BI-RADS CATEGORY  2: Benign. Electronically Signed   By: Corean Salter M.D.   On: 12/26/2020 09:37  MS DIGITAL DIAG TOMO BILAT Result Date: 12/22/2019 CLINICAL DATA:  63 year old female presenting for annual bilateral mammogram and 1 year follow-up of probably benign left breast calcifications. Patient had a benign stereotactic biopsy of an additional group of calcifications in September 2020. EXAM: DIGITAL DIAGNOSTIC BILATERAL MAMMOGRAM WITH TOMO AND CAD COMPARISON:  Previous exam(s). ACR Breast Density Category b: There are scattered areas of fibroglandular density. FINDINGS: A 6 mm group of calcifications in the lower inner quadrant of the left breast at far posterior depth are mammographically stable. Post biopsy clip is seen  just anterior. No new or suspicious findings are identified in either breast. Mammographic images were processed with CAD. IMPRESSION: 1. Stable, probably benign left breast calcifications. Recommend  a final mammographic follow-up in 1 year to coincide with the patient's annual bilateral mammogram. 2. No mammographic evidence of malignancy on the right. RECOMMENDATION: Diagnostic mammogram is suggested in 1 year. (Code:[id number]-01Y) I have discussed the findings and recommendations with the patient. If applicable, a reminder letter will be sent to the patient regarding the next appointment. BI-RADS CATEGORY  3: Probably benign. Electronically Signed   By: Serena  Chacko M.D.   On: 12/22/2019 13:56   MS DIGITAL DIAG TOMO UNI LEFT Result Date: 06/21/2019 CLINICAL DATA:  Six-month follow-up for likely benign left breast calcifications. The patient had a benign stereotactic biopsy of calcifications in September of 2020 (fibrocystic and fibroadenomatoid changes). EXAM: DIGITAL DIAGNOSTIC UNILATERAL LEFT MAMMOGRAM WITH CAD AND TOMO COMPARISON:  Previous exam(s). ACR Breast Density Category b: There are scattered areas of fibroglandular density. FINDINGS: The posterior 6 mm group of calcifications in the lower-inner quadrant of the left breast are mammographically stable. No suspicious changes are seen at the biopsy site just anterior to this group. No new suspicious calcifications, masses or areas of distortion are seen in the left breast. Mammographic images were processed with CAD. IMPRESSION: The likely benign left breast calcifications in the lower inner quadrant are stable. RECOMMENDATION: Bilateral diagnostic mammogram in August of 2021. I have discussed the findings and recommendations with the patient. If applicable, a reminder letter will be sent to the patient regarding the next appointment. BI-RADS CATEGORY  3: Probably benign. Electronically Signed   By: Rosaline Collet M.D.   On: 06/21/2019 14:49   MM LT  BREAST BX W LOC DEV 1ST LESION IMAGE BX SPEC STEREO GUIDE Addendum Date: 12/23/2018 ADDENDUM REPORT: 12/23/2018 15:23 ADDENDUM: PATHOLOGY revealed: BREAST, LEFT LOWER INNER QUADRANT- BENIGN MAMMARY PARENCHYMA WITH FIBROCYSTIC AND FIBROADENOMATOID CHANGES, WITH ASSOCIATED CALCIFICATIONS. - NO EVIDENCE OF ATYPICAL PROLIFERATIVE BREAST DISEASE. Pathology results are CONCORDANT with imaging findings, per Dr. Delon Music. Pathology results were discussed with patient via telephone [employer] # [phone]. The patient reported doing well after the biopsy with tenderness at the site. Post biopsy care instructions were reviewed and questions were answered. The patient was encouraged to call [facility] for any additional concerns. Request was made to Jesusa Holts RN and Arlean Levers RN, at [facility], for a follow up telephone call to patient to discuss patient's desire to have biopsy area removed. Recommendation: The patient was asked to return in six months for unilateral LEFT diagnostic mammogram to ensure stability of the additional group of calcifications in the lower inner left breast. Addendum by Rock Hover RN on 12/23/2018. Electronically Signed   By: Delon Music M.D.   On: 12/23/2018 15:23   Result Date: 12/23/2018 CLINICAL DATA:  63 year old female with suspicious calcifications in the lower inner left breast. EXAM: BREAST STEREOTACTIC CORE NEEDLE BIOPSY COMPARISON:  Previous exams. FINDINGS: The patient and I discussed the procedure of stereotactic-guided biopsy including benefits and alternatives. We discussed the high likelihood of a successful procedure. We discussed the risks of the procedure including infection, bleeding, tissue injury, clip migration, and inadequate sampling. Informed written consent was given. The usual time out protocol was performed immediately prior to the procedure. Using sterile technique and 1% Lidocaine as local anesthetic,  under stereotactic guidance, a 9 gauge vacuum assisted device was used to perform core needle biopsy of the calcifications in the lower inner posterior left breast using a medial to lateral approach. Specimen radiograph was performed showing the presence of calcifications. Specimens with  calcifications are identified for pathology. Lesion quadrant: Lower inner At the conclusion of the procedure, an X shaped tissue marker clip was deployed into the biopsy cavity. Follow-up 2-view mammogram was performed and dictated separately. IMPRESSION: Stereotactic-guided biopsy of the calcifications in the lower inner left breast. No apparent complications. Electronically Signed: By: Delon Music M.D. On: 12/22/2018 09:18   MS DIGITAL DIAG UNI LEFT Result Date: 12/22/2018 CLINICAL DATA:  Post stereotactic guided biopsy of the more anterior group of calcifications in the lower inner posterior left breast. EXAM: DIAGNOSTIC LEFT MAMMOGRAM POST STEREOTACTIC BIOPSY COMPARISON:  Previous exam(s). FINDINGS: Mammographic images were obtained following stereotactic guided biopsy of the more anterior group of calcifications in the lower inner posterior left breast. A ribbon shaped biopsy marking clip is present at the site of the biopsied calcifications in the lower inner posterior left breast. IMPRESSION: Ribbon shaped biopsy marking clip at site of biopsied calcifications in the lower inner posterior left breast. Final Assessment: Post Procedure Mammograms for Marker Placement Electronically Signed   By: Delon Music M.D.   On: 12/22/2018 09:31   MS DIGITAL DIAG TOMO UNI LEFT Result Date: 12/14/2018 CLINICAL DATA:  Left breast calcifications seen on baseline screening mammography. EXAM: DIGITAL DIAGNOSTIC UNILATERAL LEFT MAMMOGRAM WITH CAD AND TOMO COMPARISON:  Previous exam(s). ACR Breast Density Category b: There are scattered areas of fibroglandular density. FINDINGS: Additional mammographic views of the left breast  confirm presence of 2 adjacent group of calcifications in the left breast lower inner quadrant, posterior depth. The more anterior of the 2 groups measures 1.9 by 0.5 by 0.7 cm, and the more posterior of the 2 groups measures 0.6 by 0.3 by 0.3 cm. There are no associated masses. Mammographic images were processed with CAD. IMPRESSION: Left breast lower inner quadrant two groups of indeterminate calcifications seen on baseline screening mammography. RECOMMENDATION: Stereotactic core needle biopsy of the more anterior of the 2 groups of calcifications. With benign pathology results, six-month follow-up for the second group of calcifications is recommended. Otherwise, a second stereotactic core needle biopsy could be performed at a later time. I have discussed the findings and recommendations with the patient. Results were also provided in writing at the conclusion of the visit. If applicable, a reminder letter will be sent to the patient regarding the next appointment. BI-RADS CATEGORY  4: Suspicious. Electronically Signed   By: Dobrinka  Dimitrova M.D.   On: 12/14/2018 14:30   MS DIGITAL SCREENING TOMO BILATERAL Result Date: 12/03/2018 CLINICAL DATA:  Screening. EXAM: DIGITAL SCREENING BILATERAL MAMMOGRAM WITH TOMO AND CAD COMPARISON:  Previous exam(s). ACR Breast Density Category c: The breast tissue is heterogeneously dense, which may obscure small masses. FINDINGS: In the left breast, calcifications warrant further evaluation. In the right breast, no findings suspicious for malignancy. Images were processed with CAD. IMPRESSION: Further evaluation is suggested for calcifications in the left breast. RECOMMENDATION: Diagnostic mammogram of the left breast. (Code:[id number]) The patient will be contacted regarding the findings, and additional imaging will be scheduled. BI-RADS CATEGORY  0: Incomplete. Need additional imaging evaluation and/or prior mammograms for comparison. Electronically Signed   By: Inocente Ast M.D.   On: 12/03/2018 09:02   Pelvic/Bimanual Patient stated has a Pap smear scheduled with Dr. Janit in February 2025 for follow up Pap smear.    Smoking History: Patient has never smoked.   Patient Navigation: Patient education provided. Access to services provided for patient through [employer] program. Spanish interpreter Alexandria Ayala from [facility] provided.  Colorectal Cancer Screening: Per patient has never had colonoscopy completed. Patient completed a FIT Test 04/01/2022 and in 2024 given by her PCP that were both negative. No complaints today.    Breast and Cervical Cancer Risk Assessment: Patient does not have family history of breast cancer, known genetic mutations, or radiation treatment to the chest before age 30. Patient has history of cervical dysplasia. Patient has no history of being immunocompromised or DES exposure in-utero.  Risk Scores as of Encounter on 04/21/2023     Alisa           5-year 0.77%   Lifetime 3.94%            Last calculated by Driscilla Wanda SQUIBB, RN on 04/21/2023 at  5:57 PM       A: [facility] exam without pap smear No complaints.  P: Referred patient to the [facility] for a screening mammogram. Appointment scheduled Monday, April 21, 2023 at 1100.  Driscilla Wanda SQUIBB, RN 04/21/2023 9:37 AM

## 2023-06-17 ENCOUNTER — Ambulatory Visit: Payer: Self-pay | Admitting: Obstetrics and Gynecology

## 2023-06-17 ENCOUNTER — Encounter: Payer: Self-pay | Admitting: Obstetrics and Gynecology

## 2023-06-17 ENCOUNTER — Other Ambulatory Visit (HOSPITAL_COMMUNITY)
Admission: RE | Admit: 2023-06-17 | Discharge: 2023-06-17 | Disposition: A | Discharge status: Home / Self Care | Payer: Self-pay | Source: Ambulatory Visit | Attending: Obstetrics and Gynecology | Admitting: Obstetrics and Gynecology

## 2023-06-17 VITALS — BP 117/79 | HR 61 | Ht <= 58 in | Wt 116.4 lb

## 2023-06-17 DIAGNOSIS — B977 Papillomavirus as the cause of diseases classified elsewhere: Secondary | ICD-10-CM | POA: Insufficient documentation

## 2023-06-17 DIAGNOSIS — Z01419 Encounter for gynecological examination (general) (routine) without abnormal findings: Secondary | ICD-10-CM

## 2023-06-17 DIAGNOSIS — Z09 Encounter for follow-up examination after completed treatment for conditions other than malignant neoplasm: Secondary | ICD-10-CM

## 2023-06-17 DIAGNOSIS — Z124 Encounter for screening for malignant neoplasm of cervix: Secondary | ICD-10-CM | POA: Insufficient documentation

## 2023-06-17 NOTE — Progress Notes (Signed)
 Patients presents for annual exam today. Due for pap smear following CIN I colposcopy last year, ordered. Up to date on mammography. Annual labs are preformed at PCP office. She states no other questions or concerns at this time.

## 2023-06-17 NOTE — Progress Notes (Signed)
 HPI:      [name] is a 63 y.o. No obstetric history on file. who LMP was No LMP recorded. Patient is postmenopausal.  Subjective:   She presents today as a follow-up after abnormal Pap followed by colposcopy revealing CIN-1.  She has a history of positive HPV 16.  It has been 1 year since her colposcopy.  She presents today for follow-up.    Hx: The following portions of the patient's history were reviewed and updated as appropriate:             She  has no past medical history on file. She does not have a problem list on file. She  has a past surgical history that includes Breast biopsy (Left, 12/22/2018). Her family history is not on file. She  reports that she has never smoked. She has never used smokeless tobacco. She reports that she does not currently use alcohol. She reports that she does not use drugs. She has a current medication list which includes the following prescription(s): ibuprofen. She has no known allergies.       Review of Systems:  Review of Systems  Constitutional: Denied constitutional symptoms, night sweats, recent illness, fatigue, fever, insomnia and weight loss.  Eyes: Denied eye symptoms, eye pain, photophobia, vision change and visual disturbance.  Ears/Nose/Throat/Neck: Denied ear, nose, throat or neck symptoms, hearing loss, nasal discharge, sinus congestion and sore throat.  Cardiovascular: Denied cardiovascular symptoms, arrhythmia, chest pain/pressure, edema, exercise intolerance, orthopnea and palpitations.  Respiratory: Denied pulmonary symptoms, asthma, pleuritic pain, productive sputum, cough, dyspnea and wheezing.  Gastrointestinal: Denied, gastro-esophageal reflux, melena, nausea and vomiting.  Genitourinary: Denied genitourinary symptoms including symptomatic vaginal discharge, pelvic relaxation issues, and urinary complaints.  Musculoskeletal: Denied musculoskeletal symptoms, stiffness, swelling, muscle weakness and myalgia.   Dermatologic: Denied dermatology symptoms, rash and scar.  Neurologic: Denied neurology symptoms, dizziness, headache, neck pain and syncope.  Psychiatric: Denied psychiatric symptoms, anxiety and depression.  Endocrine: Denied endocrine symptoms including hot flashes and night sweats.   Meds:   Current Outpatient Medications on File Prior to Visit  Medication Sig Dispense Refill   ibuprofen (ADVIL) 600 MG tablet Take 600 mg by mouth every 6 (six) hours as needed for moderate pain (pain score 4-6).     No current facility-administered medications on file prior to visit.      Objective:     Vitals:   06/17/23 0913  BP: 117/79  Pulse: 61   Filed Weights   06/17/23 0913  Weight: 116 lb 6.4 oz (52.8 kg)              Physical examination   Pelvic:   Vulva: Normal appearance.  No lesions.  Vagina: No lesions or abnormalities noted.  Moderate vaginal atrophy noted.  Support: Normal pelvic support.  Urethra No masses tenderness or scarring.  Meatus Normal size without lesions or prolapse.  Cervix: Normal appearance.  No lesions.  Anus: Normal exam.  No lesions.  Perineum: Normal exam.  No lesions.        Bimanual   Uterus: Normal size.  Non-tender.  Mobile.  AV.  Adnexae: No masses.  Non-tender to palpation.  Cul-de-sac: Negative for abnormality.             Assessment:    No obstetric history on file. There are no active problems to display for this patient.    1. Well woman exam with routine gynecological exam   2. Cervical cancer screening   3.  HPV in female        Plan:            1.  Pap performed-will schedule colposcopy if necessary.  HPV and abnormal cytology again discussed in detail. Orders No orders of the defined types were placed in this encounter.   No orders of the defined types were placed in this encounter.     F/U  Return for We will contact her with any abnormal test results.  Elonda Husky, M.D. 06/17/2023 10:06 AM

## 2023-06-20 LAB — CYTOLOGY - PAP
Comment: NEGATIVE
Comment: NEGATIVE
Comment: NEGATIVE
HPV 16: POSITIVE — AB
HPV 18 / 45: NEGATIVE
High risk HPV: POSITIVE — AB

## 2023-06-27 ENCOUNTER — Telehealth: Payer: Self-pay | Admitting: Obstetrics and Gynecology

## 2023-06-27 NOTE — Telephone Encounter (Addendum)
 [name]

## 2023-07-22 ENCOUNTER — Encounter: Payer: Self-pay | Admitting: Obstetrics and Gynecology

## 2023-07-22 ENCOUNTER — Ambulatory Visit: Payer: Self-pay | Admitting: Obstetrics and Gynecology

## 2023-07-22 ENCOUNTER — Other Ambulatory Visit (HOSPITAL_COMMUNITY)
Admission: RE | Admit: 2023-07-22 | Discharge: 2023-07-22 | Disposition: A | Discharge status: Home / Self Care | Payer: Self-pay | Source: Ambulatory Visit | Attending: Obstetrics and Gynecology | Admitting: Obstetrics and Gynecology

## 2023-07-22 VITALS — BP 122/74 | HR 72 | Ht <= 58 in | Wt 116.0 lb

## 2023-07-22 DIAGNOSIS — R87810 Cervical high risk human papillomavirus (HPV) DNA test positive: Secondary | ICD-10-CM

## 2023-07-22 DIAGNOSIS — R87612 Low grade squamous intraepithelial lesion on cytologic smear of cervix (LGSIL): Secondary | ICD-10-CM | POA: Insufficient documentation

## 2023-07-22 DIAGNOSIS — N888 Other specified noninflammatory disorders of cervix uteri: Secondary | ICD-10-CM

## 2023-07-22 DIAGNOSIS — N870 Mild cervical dysplasia: Secondary | ICD-10-CM

## 2023-07-22 NOTE — Progress Notes (Signed)
   HPI:  Alexandria Ayala is a 63 y.o.  No obstetric history on file.  who presents today for evaluation and management of abnormal cervical cytology.    Dysplasia History: History of LEEP 5 years ago       HPV: Type 16    Colpo last year showed CIN-1    Pap smear this year shows LGSIL with positive HPV.  ROS:  Pertinent items noted in HPI and remainder of comprehensive ROS otherwise negative.  OB History  No obstetric history on file.    History reviewed. No pertinent past medical history.  Past Surgical History:  Procedure Laterality Date   BREAST BIOPSY Left 12/22/2018   affirm bx LT, ribbon marker, BENIGN MAMMARY PARENCHYMA WITH FIBROCYSTIC AND FIBROADENOMATOID CHANGES, WITH ASSOCIATED CALCIFICATIONS    SOCIAL HISTORY:  Social History   Substance and Sexual Activity  Alcohol Use Not Currently    Social History   Substance and Sexual Activity  Drug Use Never     Family History  Problem Relation Age of Onset   Breast cancer Neg Hx     ALLERGIES:  Patient has no known allergies.  She has a current medication list which includes the following prescription(s): ibuprofen.  Physical Exam: -Vitals:  BP 122/74   Pulse 72   Ht 4\' 9"  (1.448 m)   Wt 116 lb (52.6 kg)   BMI 25.10 kg/m   PROCEDURE: Colposcopy performed with 4% acetic acid after verbal consent obtained                          Very difficult colposcopy and biopsies as the cervix is flush with the vaginal vault.  The os is very difficult to identify because of stenosis.  Biopsies are difficult because of the flush cervix there is acetowhite change with many punctate vessels.  These do not appear to be the typical changes caused by HPV which resulted in vascularization.   -Aceto-white Lesions Location(s): See above              -Biopsy performed at 11 4 and 7 o'clock               -ECC indicated and performed: No.     -Biopsy sites made hemostatic with pressure and Monsel's solution    -Satisfactory colposcopy: Yes.      -Evidence of Invasive cervical CA :  NO  ASSESSMENT:  Alexandria Ayala is a 63 y.o. No obstetric history on file. here for  1. LGSIL on Pap smear of cervix   2. 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 Dysplasia of cervix, low grade (CIN 1)     PLAN: 1.  I discussed the grading system of pap smears and HPV high risk viral types.  We will discuss management after colpo results return.  No orders of the defined types were placed in this encounter.          F/U  Return in about 2 weeks (around 08/05/2023) for Colpo f/u.  Brennan Bailey ,MD 07/22/2023,2:21 PM

## 2023-07-22 NOTE — Progress Notes (Signed)
 Patient presents today for a colposcopy. She recently had an abnormal pap smear resulting in lgsil/hpv 16+ . No further concerns today.

## 2023-07-24 LAB — SURGICAL PATHOLOGY

## 2023-08-06 ENCOUNTER — Encounter: Payer: Self-pay | Admitting: Obstetrics and Gynecology

## 2023-08-06 ENCOUNTER — Ambulatory Visit (INDEPENDENT_AMBULATORY_CARE_PROVIDER_SITE_OTHER): Payer: Self-pay | Admitting: Obstetrics and Gynecology

## 2023-08-06 VITALS — BP 135/82 | HR 65 | Ht <= 58 in | Wt 115.9 lb

## 2023-08-06 DIAGNOSIS — R87612 Low grade squamous intraepithelial lesion on cytologic smear of cervix (LGSIL): Secondary | ICD-10-CM

## 2023-08-06 DIAGNOSIS — R87810 Cervical high risk human papillomavirus (HPV) DNA test positive: Secondary | ICD-10-CM

## 2023-08-06 NOTE — Progress Notes (Signed)
 HPI:      [name] is a 63 y.o. G2P2 who LMP was No LMP recorded. Patient is postmenopausal.  Subjective:   She presents today to discuss her colposcopy findings.  Significant note she has HPV 16.    Hx: The following portions of the patient's history were reviewed and updated as appropriate:             She  has no past medical history on file. She does not have a problem list on file. She  has a past surgical history that includes Breast biopsy (Left, 12/22/2018). Her family history is not on file. She  reports that she has never smoked. She has never used smokeless tobacco. She reports that she does not currently use alcohol. She reports that she does not use drugs. She has a current medication list which includes the following prescription(s): ibuprofen. She has no known allergies.       Review of Systems:  Review of Systems  Constitutional: Denied constitutional symptoms, night sweats, recent illness, fatigue, fever, insomnia and weight loss.  Eyes: Denied eye symptoms, eye pain, photophobia, vision change and visual disturbance.  Ears/Nose/Throat/Neck: Denied ear, nose, throat or neck symptoms, hearing loss, nasal discharge, sinus congestion and sore throat.  Cardiovascular: Denied cardiovascular symptoms, arrhythmia, chest pain/pressure, edema, exercise intolerance, orthopnea and palpitations.  Respiratory: Denied pulmonary symptoms, asthma, pleuritic pain, productive sputum, cough, dyspnea and wheezing.  Gastrointestinal: Denied, gastro-esophageal reflux, melena, nausea and vomiting.  Genitourinary: Denied genitourinary symptoms including symptomatic vaginal discharge, pelvic relaxation issues, and urinary complaints.  Musculoskeletal: Denied musculoskeletal symptoms, stiffness, swelling, muscle weakness and myalgia.  Dermatologic: Denied dermatology symptoms, rash and scar.  Neurologic: Denied neurology symptoms, dizziness, headache, neck pain and syncope.   Psychiatric: Denied psychiatric symptoms, anxiety and depression.  Endocrine: Denied endocrine symptoms including hot flashes and night sweats.   Meds:   Current Outpatient Medications on File Prior to Visit  Medication Sig Dispense Refill   ibuprofen (ADVIL) 600 MG tablet Take 600 mg by mouth every 6 (six) hours as needed for moderate pain (pain score 4-6).     No current facility-administered medications on file prior to visit.      Objective:     Vitals:   08/06/23 0828  BP: 135/82  Pulse: 65   Filed Weights   08/06/23 0828  Weight: 115 lb 14.4 oz (52.6 kg)                        Assessment:    G2P2 There are no active problems to display for this patient.    1. LGSIL on Pap smear of cervix   2. 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   Plan:            1.  I have recommended a follow-up Pap smear in 1 year.  I have discussed the natural course and history of HPV and its relationship to cervical dysplasia.  Cervical dysplasia and its relationship to cervical cancer discussed.  I have stressed the importance of follow-up Pap smears and possible subsequent colposcopies.  All questions were answered. Orders No orders of the defined types were placed in this encounter.   No orders of the defined types were placed in this encounter.     F/U  Return in about 1 year (around 08/05/2024) for Annual Physical.  Myrtie Atkinson  Wyvonne Height, M.D. 08/06/2023 9:18 AM

## 2023-08-06 NOTE — Progress Notes (Signed)
Patient presents today for a colposcopy follow-up.  No further concerns today.

## 2024-03-22 ENCOUNTER — Telehealth: Payer: Self-pay

## 2024-04-14 ENCOUNTER — Other Ambulatory Visit: Payer: Self-pay | Admitting: Family Medicine

## 2024-04-14 DIAGNOSIS — Z1231 Encounter for screening mammogram for malignant neoplasm of breast: Secondary | ICD-10-CM

## 2024-05-05 ENCOUNTER — Ambulatory Visit
Admission: RE | Admit: 2024-05-05 | Discharge: 2024-05-05 | Disposition: A | Discharge status: Home / Self Care | Payer: Self-pay | Source: Ambulatory Visit | Attending: Family Medicine | Admitting: Family Medicine

## 2024-05-05 DIAGNOSIS — Z1231 Encounter for screening mammogram for malignant neoplasm of breast: Secondary | ICD-10-CM | POA: Insufficient documentation
